# Patient Record
Sex: Male | Born: 1960 | Race: White | Hispanic: No | Marital: Single | Smoking: Never smoker
Health system: Southern US, Community
[De-identification: ages and names within clinical notes are randomized; demographics above are authoritative.]

## PROBLEM LIST (undated history)

## (undated) DIAGNOSIS — Z87442 Personal history of urinary calculi: Secondary | ICD-10-CM

## (undated) HISTORY — PX: HERNIA REPAIR: SHX51

## (undated) HISTORY — PX: URETEROSCOPY: SHX842

---

## 2015-06-25 ENCOUNTER — Ambulatory Visit (INDEPENDENT_AMBULATORY_CARE_PROVIDER_SITE_OTHER): Payer: 59 | Admitting: Osteopathic Medicine

## 2015-06-25 ENCOUNTER — Encounter: Payer: Self-pay | Admitting: Osteopathic Medicine

## 2015-06-25 VITALS — BP 144/98 | HR 60 | Ht 70.0 in | Wt 214.0 lb

## 2015-06-25 DIAGNOSIS — N529 Male erectile dysfunction, unspecified: Secondary | ICD-10-CM | POA: Diagnosis not present

## 2015-06-25 DIAGNOSIS — Z0000 Encounter for general adult medical examination without abnormal findings: Secondary | ICD-10-CM | POA: Diagnosis not present

## 2015-06-25 DIAGNOSIS — H9319 Tinnitus, unspecified ear: Secondary | ICD-10-CM | POA: Insufficient documentation

## 2015-06-25 DIAGNOSIS — H9313 Tinnitus, bilateral: Secondary | ICD-10-CM

## 2015-06-25 NOTE — Addendum Note (Signed)
Addended by: Deirdre PippinsALEXANDER, Dayvon Dax M on: 06/25/2015 05:52 PM   Modules accepted: Level of Service

## 2015-06-25 NOTE — Patient Instructions (Signed)
Plan to follow-up every 6-12 months for management/monitoring of chronic medical issues. Please don't hesitate to make an appointment sooner if you're having any acute concerns or problems!  Please let your pharmacy know when you are running low on medications/refills (do not wait until you are out of medicines). Your pharmacy will send our office a request for the appropriate medications. Please allow our office 2-3 business days to process the needed refills.   At any visits to any of your specialists, please give them our clinic information so that they can forward us any records, including any tests which are done or changes to your medications. This allows all your physicians to communicate effectively, putting your primary care doctor at the center of your medical care and allowing us to effectively coordinate your care.   Let's plan to follow-up here in the office in 6 months to recheck blood pressure. If more labs are needed, you can get lab work done a few days before that visit so that we can go over the results in person at your appointment - let us know prior to your appointment so the orders can be placed.   Please let us know if there is anything else we can do for you. Take care! -Dr. Mervyn SkeetersA.

## 2015-06-25 NOTE — Progress Notes (Signed)
HPI: Steve Rodriguez is a 55 y.o. male who presents to [facility] today for chief complaint of:  Chief Complaint  Patient presents with  . Establish Care    ANNUAL EXAM     Patient here to establish care/annual physical exam, has been several years since he has seen a doctor, he is due for routine screening blood work.   Other medical history was briefly reviewed, including patient's history of kidney stones, he sees a urologist, on injection medication for erectile dysfunction, he thinks urology has previously worked up for thyroid issues or low testosterone. Reports that he is exercising with walking and occasional running, does suffer some occasional muscle pains after exercise mostly in the hamstrings.   Past medical, social and family history reviewed: History reviewed. No pertinent past medical history. Past Surgical History  Procedure Laterality Date  . Hernia repair    . Ureteroscopy      FOR KIDNEY STONE   Social History  Substance Use Topics  . Smoking status: Never Smoker   . Smokeless tobacco: Not on file  . Alcohol Use: Not on file   History reviewed. No pertinent family history.  No current outpatient prescriptions on file.   No current facility-administered medications for this visit.   No Known Allergies    Review of Systems: CONSTITUTIONAL:  No  fever, no chills, No  unintentional weight changes HEAD/EYES/EARS/NOSE/THROAT: No  headache, no vision change, (+) ringing in ears wihtout hearing change, No  sore throat, No  sinus pressure CARDIAC: No  chest pain, No  pressure, No palpitations, No  orthopnea RESPIRATORY: No  cough, No  shortness of breath/wheeze GASTROINTESTINAL: No  nausea, No  vomiting, No  abdominal pain, No  blood in stool, No  diarrhea, No  constipation  MUSCULOSKELETAL: (+) myalgia/arthralgia GENITOURINARY: No  incontinence, No  abnormal genital bleeding/discharge, (+) erectile dysfunction SKIN: No   rash/wounds/concerning lesions HEM/ONC: No  easy bruising/bleeding, No  abnormal lymph node ENDOCRINE: No polyuria/polydipsia/polyphagia, No  heat/cold intolerance  NEUROLOGIC: No  weakness, No  dizziness, No  slurred speech PSYCHIATRIC: No  concerns with depression, No  concerns with anxiety, No sleep problems  Exam:  BP 144/98 mmHg  Pulse 60  Ht 5\' 10"  (1.778 m)  Wt 214 lb (97.07 kg)  BMI 30.71 kg/m2 blood pressure similar on manual recheck Constitutional: VS see above. General Appearance: alert, well-developed, well-nourished, NAD Eyes: Normal lids and conjunctive, non-icteric sclera,  Ears, Nose, Mouth, Throat: MMM, Normal external inspection ears/nares/mouth/lips/gums, TM normal bilaterally. Pharynx no erythema, no exudate.  Neck: No masses, trachea midline. No thyroid enlargement/tenderness/mass appreciated. No lymphadenopathy Respiratory: Normal respiratory effort. no wheeze, no rhonchi, no rales Cardiovascular: S1/S2 normal, no murmur, no rub/gallop auscultated. RRR. No lower extremity edema. Gastrointestinal: Nontender, no masses. No hepatomegaly, no splenomegaly. No hernia appreciated. Bowel sounds normal. Rectal exam deferred.  Musculoskeletal: Gait normal. No clubbing/cyanosis of digits.  Neurological: No cranial nerve deficit on limited exam. Motor and sensation intact and symmetric Skin: warm, dry, intact. No rash/ulcer. No concerning nevi or subq nodules on limited exam.   Psychiatric: Normal judgment/insight. Normal mood and affect. Oriented x3.    No results found for this or any previous visit (from the past 72 hour(s)).    ASSESSMENT/PLAN:  Annual physical exam - Plan: CBC with Differential/Platelet, TSH, VITAMIN D 25 Hydroxy (Vit-D Deficiency, Fractures), COMPLETE METABOLIC PANEL WITH GFR, Lipid panel  Tinnitus, bilateral  Erectile dysfunction, unspecified erectile dysfunction type   All questions were  answered. Visit summary with updated medication list and  pertinent instructions was printed for patient. ER/RTC precautions were reviewed with the patient. Return in about 6 months (around 12/26/2015), or sooner if needed, for BLOOD PRESSURE RECHECK.

## 2015-06-26 LAB — COMPLETE METABOLIC PANEL WITH GFR
ALT: 31 U/L (ref 9–46)
AST: 24 U/L (ref 10–35)
Albumin: 4.5 g/dL (ref 3.6–5.1)
Alkaline Phosphatase: 54 U/L (ref 40–115)
BILIRUBIN TOTAL: 0.8 mg/dL (ref 0.2–1.2)
BUN: 14 mg/dL (ref 7–25)
CO2: 24 mmol/L (ref 20–31)
CREATININE: 1.02 mg/dL (ref 0.70–1.33)
Calcium: 9.7 mg/dL (ref 8.6–10.3)
Chloride: 101 mmol/L (ref 98–110)
GFR, Est African American: >89 mL/min (ref >=60)
GFR, Est Non African American: 82 mL/min (ref >=60)
GLUCOSE: 88 mg/dL (ref 65–99)
Potassium: 4.3 mmol/L (ref 3.5–5.3)
SODIUM: 140 mmol/L (ref 135–146)
TOTAL PROTEIN: 6.8 g/dL (ref 6.1–8.1)

## 2015-06-26 LAB — LIPID PANEL
Cholesterol: 195 mg/dL (ref 125–200)
HDL: 42 mg/dL (ref >=40)
LDL CALC: 107 mg/dL (ref <130)
Total CHOL/HDL Ratio: 4.6 Ratio (ref <=5.0)
Triglycerides: 232 mg/dL — ABNORMAL HIGH (ref <150)
VLDL: 46 mg/dL — ABNORMAL HIGH (ref <30)

## 2015-06-26 LAB — CBC WITH DIFFERENTIAL/PLATELET
BASOS PCT: 1 %
Basophils Absolute: 49 cells/uL (ref 0–200)
Eosinophils Absolute: 147 cells/uL (ref 15–500)
Eosinophils Relative: 3 %
HEMATOCRIT: 45.2 % (ref 38.5–50.0)
HEMOGLOBIN: 15 g/dL (ref 13.2–17.1)
LYMPHS ABS: 2107 {cells}/uL (ref 850–3900)
Lymphocytes Relative: 43 %
MCH: 28.3 pg (ref 27.0–33.0)
MCHC: 33.2 g/dL (ref 32.0–36.0)
MCV: 85.3 fL (ref 80.0–100.0)
MONO ABS: 343 {cells}/uL (ref 200–950)
MPV: 10.4 fL (ref 7.5–12.5)
Monocytes Relative: 7 %
Neutro Abs: 2254 cells/uL (ref 1500–7800)
Neutrophils Relative %: 46 %
Platelets: 219 10*3/uL (ref 140–400)
RBC: 5.3 MIL/uL (ref 4.20–5.80)
RDW: 13.4 % (ref 11.0–15.0)
WBC: 4.9 10*3/uL (ref 3.8–10.8)

## 2015-06-26 LAB — VITAMIN D 25 HYDROXY (VIT D DEFICIENCY, FRACTURES): VIT D 25 HYDROXY: 15 ng/mL — AB (ref 30–100)

## 2015-06-26 LAB — TSH: TSH: 3.1 mIU/L (ref 0.40–4.50)

## 2015-06-28 ENCOUNTER — Encounter: Payer: Self-pay | Admitting: Osteopathic Medicine

## 2015-06-28 NOTE — Telephone Encounter (Signed)
Released lab results to MyChart for Pt to view. Will route MyChart message to PCP for review.

## 2015-08-26 ENCOUNTER — Encounter: Payer: Self-pay | Admitting: Family Medicine

## 2015-08-26 ENCOUNTER — Ambulatory Visit (INDEPENDENT_AMBULATORY_CARE_PROVIDER_SITE_OTHER): Payer: 59

## 2015-08-26 ENCOUNTER — Ambulatory Visit (INDEPENDENT_AMBULATORY_CARE_PROVIDER_SITE_OTHER): Payer: 59 | Admitting: Family Medicine

## 2015-08-26 VITALS — BP 141/91 | HR 68 | Temp 98.8°F | Wt 209.0 lb

## 2015-08-26 DIAGNOSIS — M79671 Pain in right foot: Secondary | ICD-10-CM

## 2015-08-26 DIAGNOSIS — J040 Acute laryngitis: Secondary | ICD-10-CM | POA: Diagnosis not present

## 2015-08-26 MED ORDER — PREDNISONE 10 MG PO TABS: 30.0000 mg | ORAL_TABLET | Freq: Every day | ORAL | Status: DC

## 2015-08-26 MED ORDER — AZITHROMYCIN 250 MG PO TABS: 250.0000 mg | ORAL_TABLET | Freq: Every day | ORAL | Status: DC

## 2015-08-26 NOTE — Patient Instructions (Signed)
Thank you for coming in today. Get xray now.  If not better take the prednisone and antibiotic.  Use over the counter medicines as needed.  Call or go to the emergency room if you get worse, have trouble breathing, have chest pains, or palpitations.   Laryngitis Laryngitis is inflammation of your vocal cords. This causes hoarseness, coughing, loss of voice, sore throat, or a dry throat. Your vocal cords are two bands of muscles that are found in your throat. When you speak, these cords come together and vibrate. These vibrations come out through your mouth as sound. When your vocal cords are inflamed, your voice sounds different. Laryngitis can be temporary (acute) or long-term (chronic). Most cases of acute laryngitis improve with time. Chronic laryngitis is laryngitis that lasts for more than three weeks. CAUSES Acute laryngitis may be caused by:  A viral infection.  Lots of talking, yelling, or singing. This is also called vocal strain.  Bacterial infections. Chronic laryngitis may be caused by:  Vocal strain.  Injury to your vocal cords.  Acid reflux (gastroesophageal reflux disease or GERD).  Allergies.  Sinus infection.  Smoking.  Alcohol abuse.  Breathing in chemicals or dust.  Growths on the vocal cords. RISK FACTORS Risk factors for laryngitis include:  Smoking.  Alcohol abuse.  Having allergies. SIGNS AND SYMPTOMS Symptoms of laryngitis may include:  Low, hoarse voice.  Loss of voice.  Dry cough.  Sore throat.  Stuffy nose. DIAGNOSIS Laryngitis may be diagnosed by:  Physical exam.  Throat culture.  Blood test.  Laryngoscopy. This procedure allows your health care provider to look at your vocal cords with a mirror or viewing tube. TREATMENT Treatment for laryngitis depends on what is causing it. Usually, treatment involves resting your voice and using medicines to soothe your throat. However, if your laryngitis is caused by a bacterial  infection, you may need to take antibiotic medicine. If your laryngitis is caused by a growth, you may need to have a procedure to remove it. HOME CARE INSTRUCTIONS  Drink enough fluid to keep your urine clear or pale yellow.  Breathe in moist air. Use a humidifier if you live in a dry climate.  Take medicines only as directed by your health care provider.  If you were prescribed an antibiotic medicine, finish it all even if you start to feel better.  Do not smoke cigarettes or electronic cigarettes. If you need help quitting, ask your health care provider.  Talk as little as possible. Also avoid whispering, which can cause vocal strain.  Write instead of talking. Do this until your voice is back to normal. SEEK MEDICAL CARE IF:  You have a fever.  You have increasing pain.  You have difficulty swallowing. SEEK IMMEDIATE MEDICAL CARE IF:  You cough up blood.  You have trouble breathing.   This information is not intended to replace advice given to you by your health care provider. Make sure you discuss any questions you have with your health care provider.   Document Released: 01/26/2005 Document Revised: 02/16/2014 Document Reviewed: 07/11/2013 Elsevier Interactive Patient Education [employer] Elsevier Inc.

## 2015-08-26 NOTE — Progress Notes (Signed)
       Steve Rodriguez is a 55 y.o. male who [facility] today for sore throat and bump on foot.  Sore throat: Patient notes a 3 day history of sore throat cough congestion runny nose and hoarse voice. The cough is mildly productive. He denies any shortness of breath fevers chills nausea vomiting or diarrhea. He notes initial body aches now better. He's tried some over-the-counter medicines which help a lot. He notes his girlfriend was recently diagnosed with laryngitis and treated with steroids and antibiotic which helped a lot.  Right foot pain: Patient is a one-month history of a small swelling and bump on the right lateral foot. This initially was quite painful but now is feeling much better. He is able to walk normally. He denies any known injury. He has not had really any treatment yet. No fevers or chills.    No past medical history on file. Past Surgical History  Procedure Laterality Date  . Hernia repair    . Ureteroscopy      FOR KIDNEY STONE   Social History  Substance Use Topics  . Smoking status: Never Smoker   . Smokeless tobacco: Not on file  . Alcohol Use: Not on file   family history is not on file.  ROS as above:  Medications: Current Outpatient Prescriptions  Medication Sig Dispense Refill  . azithromycin (ZITHROMAX) 250 MG tablet Take 1 tablet (250 mg total) by mouth daily. Take first 2 tablets together, then 1 every day until finished. 6 tablet 0  . NONFORMULARY OR COMPOUNDED ITEM     . predniSONE (DELTASONE) 10 MG tablet Take 3 tablets (30 mg total) by mouth daily with breakfast. 15 tablet 0   No current facility-administered medications for this visit.   No Known Allergies   Exam:  BP 141/91 mmHg  Pulse 68  Temp(Src) 98.8 F (37.1 C) (Oral)  Wt 209 lb (94.802 kg) Gen: Well NAD HEENT: EOMI,  MMM Posterior pharynx with  cobblestoning. Clear nasal discharge. Normal posterior pharynx. No significant cervical lymphadenopathy.  Lungs: Normal work of breathing. CTABL Heart: RRR no MRG Abd: NABS, Soft. Nondistended, Nontender Exts: Brisk capillary refill, warm and well perfused.  Right foot: Slight prominence at the lateral midfoot. Nontender. Normal foot motion. Stable ligamentous exam. Normal strength. Pulses capillary refill and sensation are intact. Normal gait.  No results found for this or any previous visit (from the past 24 hour(s)). No results found.    Assessment and Plan: 55 y.o. male with  1) laryngitis versus viral URI: Plan for symptomatic management with over-the-counter medications and watchful waiting. If not better use prednisone and azithromycin prescriptions which are printed to use as a backup.  2) right foot pain: This is a bony prominence of the tendon insertion onto the calcaneus. It appears to be nontender. Normal foot motion and strength. Plan for x-ray otherwise if normal plan for watchful waiting.   Discussed warning signs or symptoms. Please see discharge instructions. Patient expresses understanding.

## 2015-08-27 ENCOUNTER — Encounter: Payer: Self-pay | Admitting: Family Medicine

## 2015-08-27 NOTE — Progress Notes (Signed)
Quick Note:    Foot xray is normal.  ______

## 2016-08-04 ENCOUNTER — Emergency Department (INDEPENDENT_AMBULATORY_CARE_PROVIDER_SITE_OTHER)
Admission: EM | Admit: 2016-08-04 | Discharge: 2016-08-04 | Disposition: A | Discharge status: Home / Self Care | Payer: 59 | Source: Home / Self Care | Attending: Family Medicine | Admitting: Family Medicine

## 2016-08-04 ENCOUNTER — Encounter: Payer: Self-pay | Admitting: *Deleted

## 2016-08-04 DIAGNOSIS — S161XXA Strain of muscle, fascia and tendon at neck level, initial encounter: Secondary | ICD-10-CM

## 2016-08-04 HISTORY — DX: Personal history of urinary calculi: Z87.442

## 2016-08-04 MED ORDER — CYCLOBENZAPRINE HCL 10 MG PO TABS: 10.0000 mg | ORAL_TABLET | Freq: Two times a day (BID) | ORAL | 0 refills | Status: AC | PRN

## 2016-08-04 NOTE — Discharge Instructions (Signed)
° °  Flexeril (cyclobenzaprine) is a muscle relaxer and may cause drowsiness. Do not drink alcohol, drive, or operate heavy machinery while taking. ° °You may take 500mg acetaminophen every 4-6 hours or in combination with ibuprofen 400-600mg every 6-8 hours.   ° °
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
## 2016-09-08 ENCOUNTER — Encounter: Payer: Self-pay | Admitting: Osteopathic Medicine

## 2017-02-01 IMAGING — DX DG FOOT COMPLETE 3+V*R*
3 series · 3 of 3 positions shown · non-contrast
Comparison: None.

CLINICAL DATA: Right foot pain and swelling for 1 month without
known injury.

EXAM:
RIGHT FOOT COMPLETE - 3+ VIEW

[foot ap]
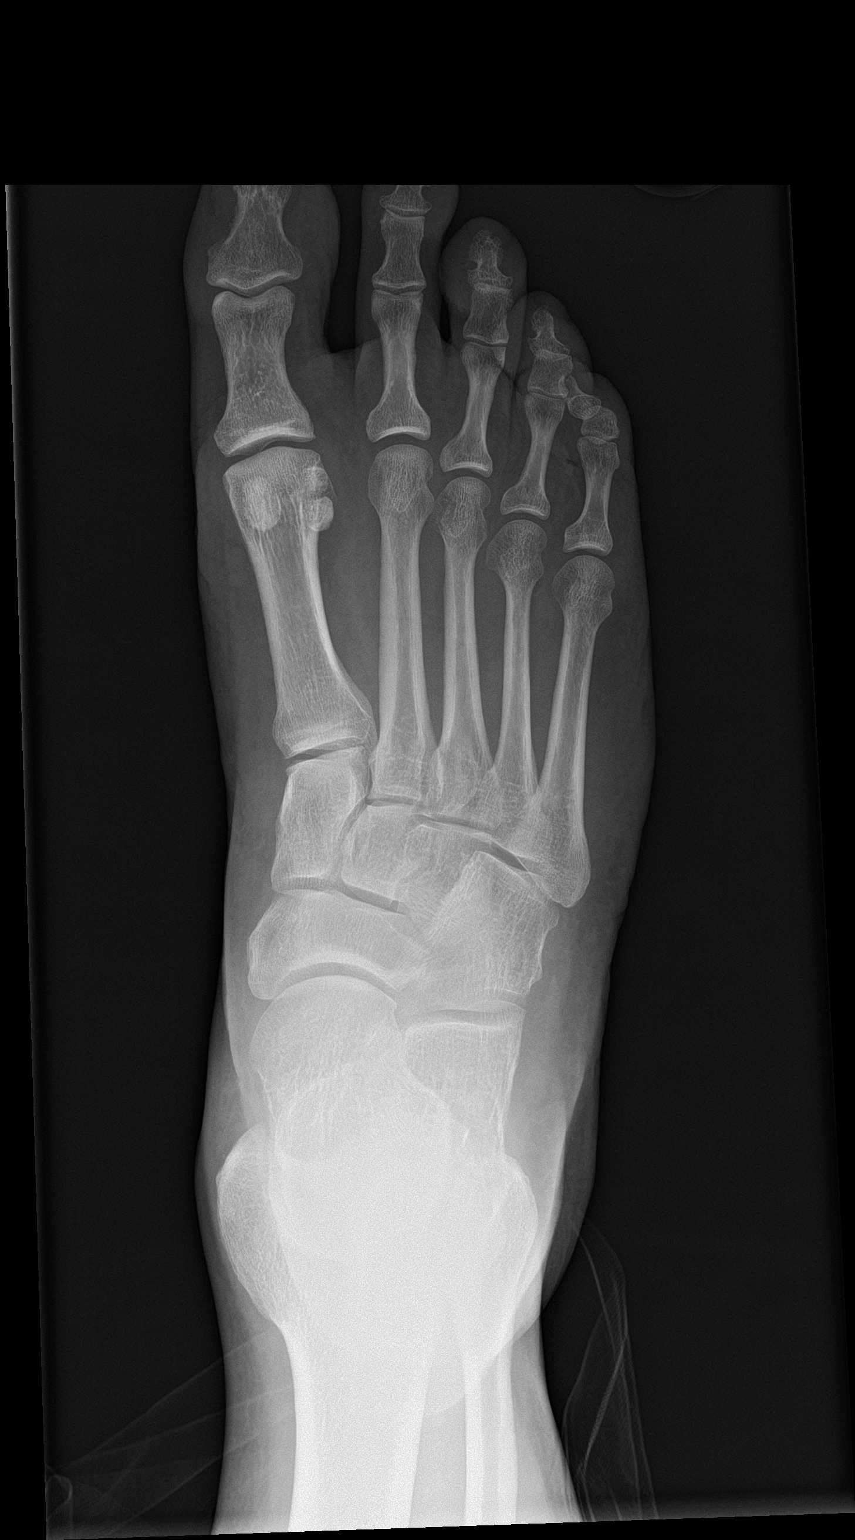

[foot obl]
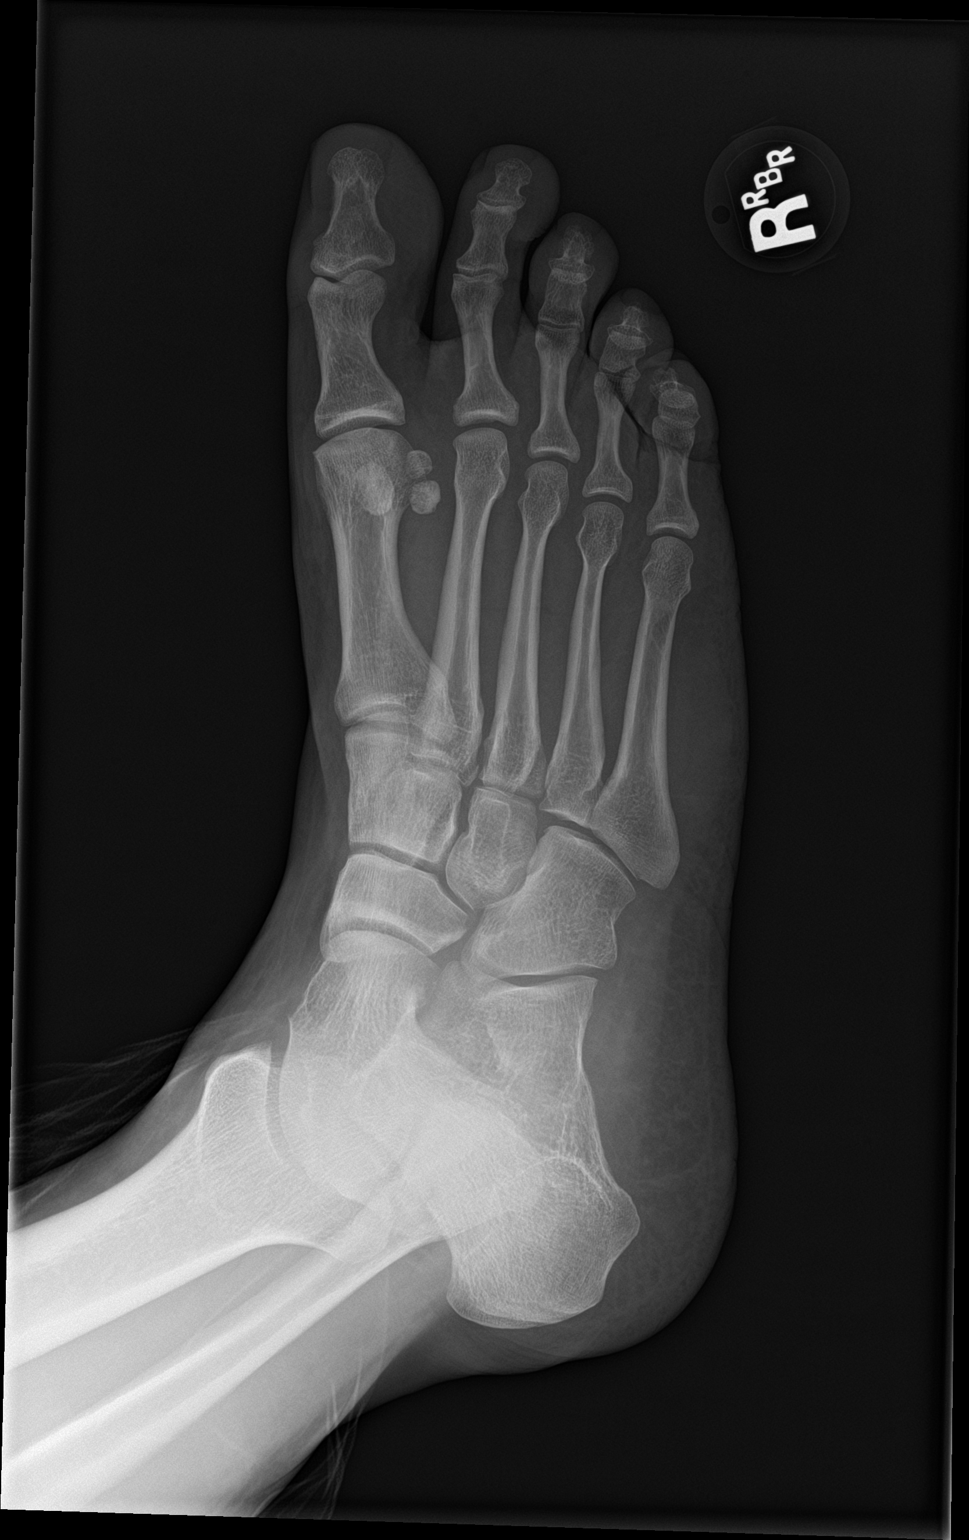

[foot lat]
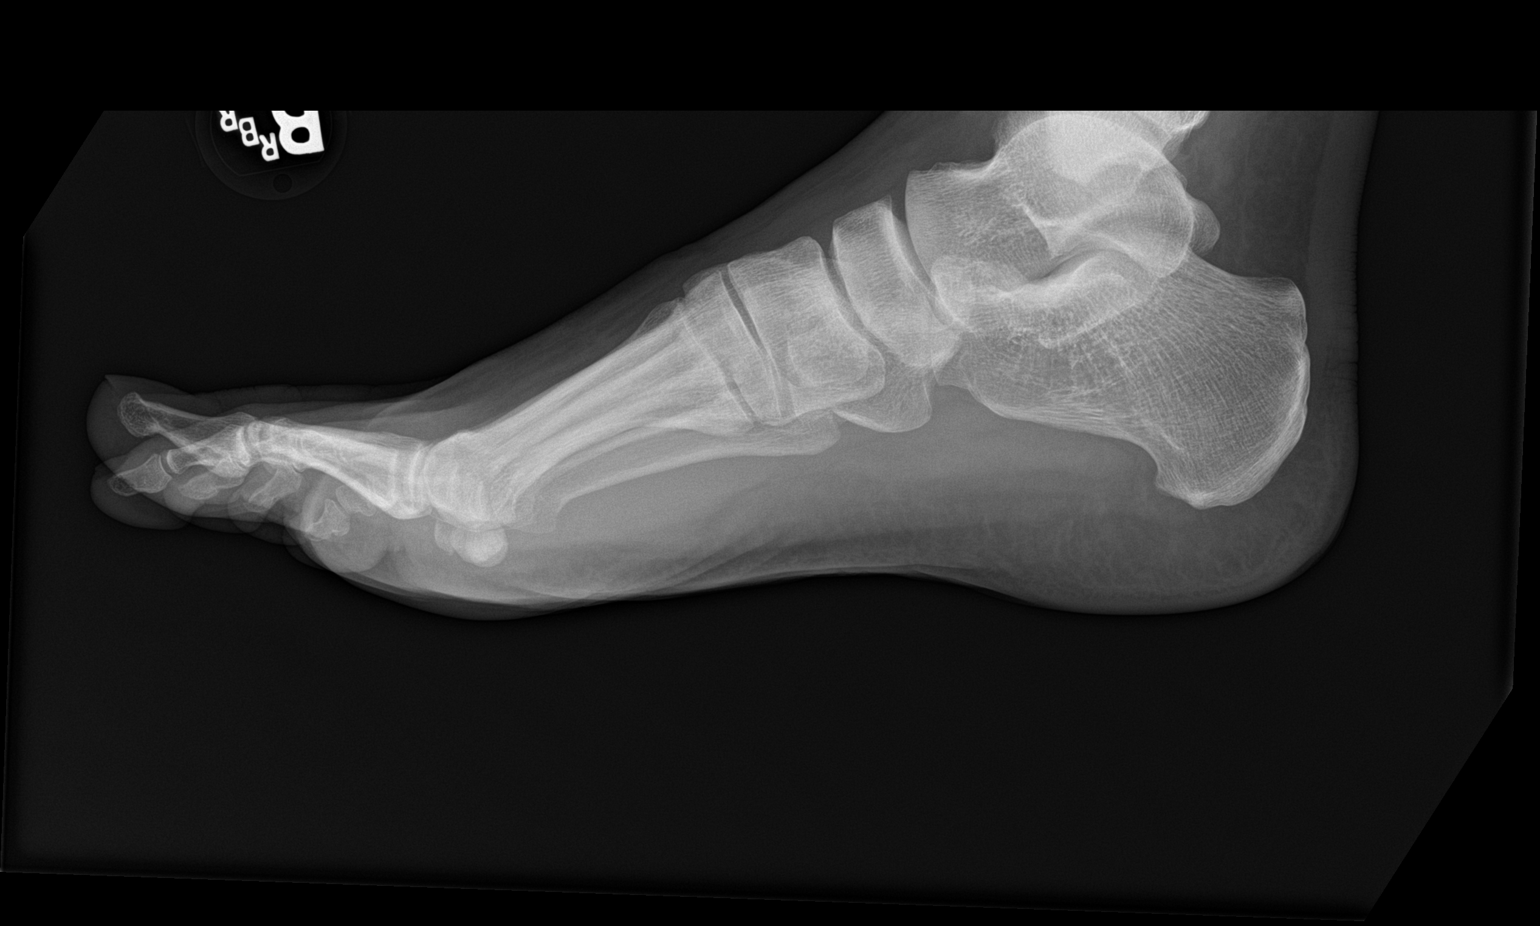

[3 of 3 positions shown; findings below may reference images not displayed]

FINDINGS: There is no evidence of fracture or dislocation. There is no
evidence of arthropathy or other focal bone abnormality. Soft
tissues are unremarkable.
IMPRESSION: Normal right foot.

## 2017-12-15 ENCOUNTER — Encounter: Payer: Self-pay | Admitting: Family Medicine

## 2017-12-15 ENCOUNTER — Ambulatory Visit (INDEPENDENT_AMBULATORY_CARE_PROVIDER_SITE_OTHER): Payer: 59 | Admitting: Family Medicine

## 2017-12-15 VITALS — BP 166/96 | HR 55 | Ht 70.0 in | Wt 203.0 lb

## 2017-12-15 DIAGNOSIS — R399 Unspecified symptoms and signs involving the genitourinary system: Secondary | ICD-10-CM | POA: Diagnosis not present

## 2017-12-15 DIAGNOSIS — Z1211 Encounter for screening for malignant neoplasm of colon: Secondary | ICD-10-CM | POA: Diagnosis not present

## 2017-12-15 DIAGNOSIS — K6289 Other specified diseases of anus and rectum: Secondary | ICD-10-CM

## 2017-12-15 MED ORDER — POLYETHYLENE GLYCOL 3350 17 GM/SCOOP PO POWD: 17.0000 g | Freq: Every day | ORAL | 1 refills | Status: AC

## 2017-12-15 NOTE — Progress Notes (Signed)
Steve Rodriguez is a 57 y.o. male who presents to [facility] today for pelvic pain and pressure ongoing now for a few days.  Patient denies any injury fevers or chills nausea vomiting or diarrhea.  He notes he has an existing history of some lower urinary tract symptoms including pelvic pressure and nocturia and weak urinary stream.  He notes he has bowel movements regularly and has not had any change.  He denies any blood in the stool.  No fevers chills nausea vomiting or diarrhea.  He is never had colon cancer screening or colonoscopy.  Is been sometime since his last thorough medical evaluation.  AUA symptom score: 8/35 (see scanned documents) Quality-of-life score 1/6  ROS as above:  Exam:  BP (!) 166/96   Pulse (!) 55   Ht 5\' 10"  (1.778 m)   Wt 203 lb (92.1 kg)   BMI 29.13 kg/m  Wt Readings from Last 5 Encounters:  12/15/17 203 lb (92.1 kg)  08/04/16 212 lb (96.2 kg)  08/26/15 209 lb (94.8 kg)  06/25/15 214 lb (97.1 kg)    Gen: Well NAD nontoxic appearing HEENT: EOMI,  MMM Lungs: Normal work of breathing. CTABL Heart: RRR no MRG Abd: NABS, Soft. Nondistended, Nontender Exts: Brisk capillary refill, warm and well perfused.  Rectal exam normal-appearing anus.  Normal rectal tone.  Prostate is slightly enlarged nontender with no nodules.  No rectal masses palpated.     Assessment and Plan: 57 y.o. male with  Pelvic pressure and mild rectal pain.  Unclear etiology.  Plan for limited laboratory work-up listed below.  Additionally will use a trial of MiraLAX as a suspect patient is a bit constipated.  If not improving recheck for further assessment.  Only patient is well overdue for colon cancer screening.  Plan to refer to gastroenterology for screening colonoscopy.   Orders Placed This Encounter  Procedures  . COMPLETE METABOLIC PANEL WITH GFR  . CBC    . PSA  . Ambulatory referral to Gastroenterology    Referral Priority:   Routine    Referral Type:   Consultation    Referral Reason:   Specialty Services Required    Number of Visits Requested:   1   Meds ordered this encounter  Medications  . polyethylene glycol powder (GLYCOLAX/MIRALAX) powder    Sig: Take 17 g by mouth daily.    Dispense:  850 g    Refill:  1     Historical information moved to improve visibility of documentation.  Past Medical History:  Diagnosis Date  . History of kidney stones    Past Surgical History:  Procedure Laterality Date  . HERNIA REPAIR    . URETEROSCOPY     FOR KIDNEY STONE   Social History   Tobacco Use  . Smoking status: Never Smoker  . Smokeless tobacco: Never Used  Substance Use Topics  . Alcohol use: Not on file   family history includes Atrial fibrillation in his mother; Hypertension in his father.  Medications: Current Outpatient Medications  Medication Sig Dispense Refill  . cyclobenzaprine (FLEXERIL) 10 MG tablet Take 1 tablet (10 mg total) by mouth 2 (two) times daily as needed. 20 tablet 0  . NONFORMULARY OR COMPOUNDED ITEM     . polyethylene glycol powder (GLYCOLAX/MIRALAX) powder Take 17 g by mouth daily. 850 g 1   No current facility-administered medications for this visit.    No Known Allergies  Discussed warning signs or symptoms. Please see discharge instructions. Patient expresses understanding.

## 2017-12-15 NOTE — Patient Instructions (Signed)
Thank you for coming in today. Get labs now.  Take miralax daily.  You should hear form Gastroenterology soon about screening colonscopy.   Let us know if not improving or if worsening.     Pelvic Pain, Male Pelvic pain is pain in your lower abdomen, below your belly button and between your hips. The pain may start suddenly (acute), keep coming back (recurring), or last a long time (chronic). Pelvic pain that lasts longer than six months is considered chronic. Pelvic pain may affect your:  Prostate gland.  Urinary system.  Digestive tract.  Musculoskeletal system.  There are many potential causes of pelvic pain. Sometimes, the pain can be a result of prostate, urinary, or digestive conditions. Strained muscles or ligaments may also cause pelvic pain. Sometimes, the cause is not known. Follow these instructions at home:  Take over-the-counter and prescription medicines only as told by your health care provider.  Rest as told by your health care provider.  Keep a journal of your pelvic pain. Write down: ? When the pain started. ? Where the pain is located. ? What seems to make the pain better or worse. ? Any symptoms you have along with the pain.  Keep all follow-up visits as told by your health care provider. This is important. Contact a health care provider if:  Medicine does not help your pain.  Your pain comes back.  You have new symptoms.  You have a fever or chills.  You are constipated.  You have blood in your urine or stool.  You feel weak or lightheaded. Get help right away if:  You have sudden severe pain.  Your pain steadily gets worse.  You have severe pain along with fever, nausea, vomiting, or excessive sweating.  You lose consciousness. This information is not intended to replace advice given to you by your health care provider. Make sure you discuss any questions you have with your health care provider. Document Released: 10/21/2011 Document  Revised: 02/20/2015 Document Reviewed: 11/16/2014 Elsevier Interactive Patient Education  [employer].

## 2017-12-16 LAB — COMPLETE METABOLIC PANEL WITH GFR
AG RATIO: 2.2 (calc) (ref 1.0–2.5)
ALBUMIN MSPROF: 4.7 g/dL (ref 3.6–5.1)
ALT: 33 U/L (ref 9–46)
AST: 17 U/L (ref 10–35)
Alkaline phosphatase (APISO): 46 U/L (ref 40–115)
BUN: 15 mg/dL (ref 7–25)
CO2: 30 mmol/L (ref 20–32)
CREATININE: 0.97 mg/dL (ref 0.70–1.33)
Calcium: 9.9 mg/dL (ref 8.6–10.3)
Chloride: 104 mmol/L (ref 98–110)
GFR, Est African American: 99 mL/min/{1.73_m2} (ref >=60)
GFR, Est Non African American: 85 mL/min/{1.73_m2} (ref >=60)
GLOBULIN: 2.1 g/dL (ref 1.9–3.7)
Glucose, Bld: 94 mg/dL (ref 65–139)
POTASSIUM: 4.2 mmol/L (ref 3.5–5.3)
SODIUM: 141 mmol/L (ref 135–146)
Total Bilirubin: 0.9 mg/dL (ref 0.2–1.2)
Total Protein: 6.8 g/dL (ref 6.1–8.1)

## 2017-12-16 LAB — PSA: PSA: 1.1 ng/mL (ref <=4.0)

## 2017-12-16 LAB — CBC
HEMATOCRIT: 45.7 % (ref 38.5–50.0)
Hemoglobin: 15.6 g/dL (ref 13.2–17.1)
MCH: 28.6 pg (ref 27.0–33.0)
MCHC: 34.1 g/dL (ref 32.0–36.0)
MCV: 83.9 fL (ref 80.0–100.0)
MPV: 11 fL (ref 7.5–12.5)
Platelets: 238 10*3/uL (ref 140–400)
RBC: 5.45 10*6/uL (ref 4.20–5.80)
RDW: 13 % (ref 11.0–15.0)
WBC: 5.7 10*3/uL (ref 3.8–10.8)

## 2018-02-17 ENCOUNTER — Encounter: Payer: Self-pay | Admitting: Family Medicine

## 2018-02-28 DIAGNOSIS — M545 Low back pain: Secondary | ICD-10-CM | POA: Diagnosis not present

## 2018-04-22 DIAGNOSIS — Z1211 Encounter for screening for malignant neoplasm of colon: Secondary | ICD-10-CM | POA: Diagnosis not present

## 2018-04-22 DIAGNOSIS — D120 Benign neoplasm of cecum: Secondary | ICD-10-CM | POA: Diagnosis not present

## 2018-04-22 DIAGNOSIS — K648 Other hemorrhoids: Secondary | ICD-10-CM | POA: Diagnosis not present

## 2018-05-03 DIAGNOSIS — Z712 Person consulting for explanation of examination or test findings: Secondary | ICD-10-CM | POA: Diagnosis not present

## 2018-05-03 DIAGNOSIS — I10 Essential (primary) hypertension: Secondary | ICD-10-CM | POA: Diagnosis not present

## 2018-05-03 DIAGNOSIS — E7800 Pure hypercholesterolemia, unspecified: Secondary | ICD-10-CM | POA: Diagnosis not present

## 2018-05-30 DIAGNOSIS — I10 Essential (primary) hypertension: Secondary | ICD-10-CM | POA: Diagnosis not present

## 2018-05-30 DIAGNOSIS — E7800 Pure hypercholesterolemia, unspecified: Secondary | ICD-10-CM | POA: Diagnosis not present

## 2018-08-26 DIAGNOSIS — I10 Essential (primary) hypertension: Secondary | ICD-10-CM | POA: Diagnosis not present

## 2018-09-15 DIAGNOSIS — H40013 Open angle with borderline findings, low risk, bilateral: Secondary | ICD-10-CM | POA: Diagnosis not present

## 2018-11-03 DIAGNOSIS — H401131 Primary open-angle glaucoma, bilateral, mild stage: Secondary | ICD-10-CM | POA: Diagnosis not present

## 2018-11-29 DIAGNOSIS — Z23 Encounter for immunization: Secondary | ICD-10-CM | POA: Diagnosis not present

## 2018-11-29 DIAGNOSIS — E7800 Pure hypercholesterolemia, unspecified: Secondary | ICD-10-CM | POA: Diagnosis not present

## 2018-11-29 DIAGNOSIS — I10 Essential (primary) hypertension: Secondary | ICD-10-CM | POA: Diagnosis not present

## 2019-01-03 DIAGNOSIS — H401131 Primary open-angle glaucoma, bilateral, mild stage: Secondary | ICD-10-CM | POA: Diagnosis not present

## 2019-03-27 DIAGNOSIS — H401131 Primary open-angle glaucoma, bilateral, mild stage: Secondary | ICD-10-CM | POA: Diagnosis not present

## 2019-06-22 DIAGNOSIS — H401131 Primary open-angle glaucoma, bilateral, mild stage: Secondary | ICD-10-CM | POA: Diagnosis not present

## 2019-11-07 DIAGNOSIS — H40013 Open angle with borderline findings, low risk, bilateral: Secondary | ICD-10-CM | POA: Diagnosis not present

## 2019-12-25 DIAGNOSIS — E7800 Pure hypercholesterolemia, unspecified: Secondary | ICD-10-CM | POA: Diagnosis not present

## 2019-12-25 DIAGNOSIS — Z23 Encounter for immunization: Secondary | ICD-10-CM | POA: Diagnosis not present

## 2019-12-25 DIAGNOSIS — Z6830 Body mass index (BMI) 30.0-30.9, adult: Secondary | ICD-10-CM | POA: Diagnosis not present

## 2019-12-25 DIAGNOSIS — I10 Essential (primary) hypertension: Secondary | ICD-10-CM | POA: Diagnosis not present

## 2020-02-23 DIAGNOSIS — M546 Pain in thoracic spine: Secondary | ICD-10-CM | POA: Diagnosis not present

## 2020-02-27 DIAGNOSIS — H40013 Open angle with borderline findings, low risk, bilateral: Secondary | ICD-10-CM | POA: Diagnosis not present
